# Patient Record
Sex: Female | Born: 1965 | Race: White | Hispanic: No | Marital: Married | Smoking: Former smoker
Health system: Southern US, Community
[De-identification: ages and names within clinical notes are randomized; demographics above are authoritative.]

## PROBLEM LIST (undated history)

## (undated) DIAGNOSIS — F329 Major depressive disorder, single episode, unspecified: Secondary | ICD-10-CM

## (undated) DIAGNOSIS — F32A Depression, unspecified: Secondary | ICD-10-CM

## (undated) DIAGNOSIS — C50911 Malignant neoplasm of unspecified site of right female breast: Secondary | ICD-10-CM

## (undated) HISTORY — DX: Major depressive disorder, single episode, unspecified: F32.9

## (undated) HISTORY — PX: TUBAL LIGATION: SHX77

## (undated) HISTORY — DX: Depression, unspecified: F32.A

## (undated) HISTORY — DX: Malignant neoplasm of unspecified site of right female breast: C50.911

---

## 2004-12-25 ENCOUNTER — Encounter: Payer: Self-pay | Admitting: Family Medicine

## 2005-01-15 ENCOUNTER — Other Ambulatory Visit: Admission: RE | Admit: 2005-01-15 | Discharge: 2005-01-15 | Payer: Self-pay | Admitting: Family Medicine

## 2005-01-15 ENCOUNTER — Ambulatory Visit: Payer: Self-pay | Admitting: Family Medicine

## 2005-02-19 ENCOUNTER — Ambulatory Visit: Payer: Self-pay | Admitting: Family Medicine

## 2005-08-24 ENCOUNTER — Ambulatory Visit: Payer: Self-pay | Admitting: Family Medicine

## 2006-02-01 DIAGNOSIS — E669 Obesity, unspecified: Secondary | ICD-10-CM | POA: Insufficient documentation

## 2006-06-30 ENCOUNTER — Encounter: Payer: Self-pay | Admitting: Family Medicine

## 2006-07-05 ENCOUNTER — Encounter (INDEPENDENT_AMBULATORY_CARE_PROVIDER_SITE_OTHER): Payer: Self-pay | Admitting: Specialist

## 2006-07-05 ENCOUNTER — Ambulatory Visit: Payer: Self-pay | Admitting: Family Medicine

## 2006-07-05 ENCOUNTER — Other Ambulatory Visit: Admission: RE | Admit: 2006-07-05 | Discharge: 2006-07-05 | Payer: Self-pay | Admitting: Family Medicine

## 2006-07-06 ENCOUNTER — Encounter: Payer: Self-pay | Admitting: Family Medicine

## 2006-07-06 ENCOUNTER — Telehealth: Payer: Self-pay | Admitting: Family Medicine

## 2006-07-06 LAB — CONVERTED CEMR LAB: Trich, Wet Prep: NONE SEEN

## 2006-07-08 ENCOUNTER — Telehealth: Payer: Self-pay | Admitting: Family Medicine

## 2006-07-12 ENCOUNTER — Telehealth (INDEPENDENT_AMBULATORY_CARE_PROVIDER_SITE_OTHER): Payer: Self-pay | Admitting: *Deleted

## 2006-07-12 ENCOUNTER — Encounter: Payer: Self-pay | Admitting: Family Medicine

## 2006-07-13 ENCOUNTER — Ambulatory Visit: Payer: Self-pay | Admitting: Family Medicine

## 2006-07-13 DIAGNOSIS — R7301 Impaired fasting glucose: Secondary | ICD-10-CM

## 2006-07-13 LAB — CONVERTED CEMR LAB: Blood Glucose, Fasting: 111 mg/dL

## 2007-01-11 ENCOUNTER — Ambulatory Visit: Payer: Self-pay | Admitting: Family Medicine

## 2007-01-11 DIAGNOSIS — L280 Lichen simplex chronicus: Secondary | ICD-10-CM

## 2008-12-16 ENCOUNTER — Ambulatory Visit: Payer: Self-pay | Admitting: Family Medicine

## 2008-12-16 DIAGNOSIS — N390 Urinary tract infection, site not specified: Secondary | ICD-10-CM | POA: Insufficient documentation

## 2008-12-16 LAB — CONVERTED CEMR LAB
Bilirubin Urine: NEGATIVE
Glucose, Urine, Semiquant: 100
Nitrite: POSITIVE

## 2009-10-30 ENCOUNTER — Ambulatory Visit: Payer: Self-pay | Admitting: Family Medicine

## 2009-10-30 ENCOUNTER — Other Ambulatory Visit: Admission: RE | Admit: 2009-10-30 | Discharge: 2009-10-30 | Payer: Self-pay | Admitting: Family Medicine

## 2009-10-30 DIAGNOSIS — R635 Abnormal weight gain: Secondary | ICD-10-CM

## 2009-10-31 ENCOUNTER — Encounter: Payer: Self-pay | Admitting: Family Medicine

## 2009-11-03 LAB — CONVERTED CEMR LAB
AST: 15 units/L (ref 0–37)
Albumin: 4 g/dL (ref 3.5–5.2)
Alkaline Phosphatase: 71 units/L (ref 39–117)
Calcium: 8.7 mg/dL (ref 8.4–10.5)
Chloride: 108 meq/L (ref 96–112)
Glucose, Bld: 108 mg/dL — ABNORMAL HIGH (ref 70–99)
LDL Cholesterol: 114 mg/dL — ABNORMAL HIGH (ref 0–99)
Potassium: 4.6 meq/L (ref 3.5–5.3)
Sodium: 140 meq/L (ref 135–145)
TSH: 1.6 microintl units/mL (ref 0.350–4.500)
Total Protein: 6.8 g/dL (ref 6.0–8.3)

## 2010-02-28 ENCOUNTER — Emergency Department (HOSPITAL_BASED_OUTPATIENT_CLINIC_OR_DEPARTMENT_OTHER): Admission: EM | Admit: 2010-02-28 | Discharge: 2010-02-28 | Payer: Self-pay | Admitting: Emergency Medicine

## 2010-03-01 ENCOUNTER — Ambulatory Visit: Payer: Self-pay | Admitting: Interventional Radiology

## 2010-03-25 ENCOUNTER — Telehealth (INDEPENDENT_AMBULATORY_CARE_PROVIDER_SITE_OTHER): Payer: Self-pay | Admitting: *Deleted

## 2010-04-08 DIAGNOSIS — K810 Acute cholecystitis: Secondary | ICD-10-CM

## 2010-04-26 HISTORY — PX: CHOLECYSTECTOMY, LAPAROSCOPIC: SHX56

## 2010-05-26 NOTE — Assessment & Plan Note (Signed)
Summary: CPE with pap   Vital Signs:  Patient profile:   45 year old female Menstrual status:  regular LMP:     10/06/2009 Height:      63.25 inches Weight:      196 pounds BMI:     34.57 O2 Sat:      98 % on Room air Pulse rate:   97 / minute BP sitting:   132 / 82  (left arm) Cuff size:   large  Vitals Entered By: Payton Spark CMA (October 30, 2009 9:32 AM)  O2 Flow:  Room air CC: CPE w/ pap LMP (date): 10/06/2009     Menstrual Status regular Enter LMP: 10/06/2009 Last PAP Result Done   Primary Care Provider:  Seymour Bars DO  CC:  CPE w/ pap.  History of Present Illness: 45 yo WF presents for CPE with pap smear.  Periods are regular each month.  She had a BTL for contraception.  Denies vasomotor flushing.  She is going thru a divorce.  She is still smoking.  She is fairly happy.  She denies fam hx of breast or colon cancer.  Denies fam hx of premature heart dz.  Tdap done in 08.  Denies hx of abnormal pap.  Denies voiding problems or vaginal discharge.  She is overdue for a mammogram.  Taking MVI daily.  Has a fairly poor diet, sedentary job and no exercise.    Current Medications (verified): 1)  Multivitamins  Tabs (Multiple Vitamin) 2)  Claritin 10 Mg Caps (Loratadine)  Allergies (verified): 1)  ! Codeine  Past History:  Past Medical History: G1P1001  NSVD  hx depression    Past Surgical History: Reviewed history from 02/01/2006 and no changes required. BTL  Family History: Reviewed history from 06/30/2006 and no changes required. 1 brother died in MVA  1 sister healthy  father DM, HTN  Mother healthy  Social History: Reviewed history from 02/01/2006 and no changes required. Getting divorced  to [place].  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  Has 1 son, 2 yo  and 2 step children.  Walks 3 days wk, trying to lose wt.  Smokes 1/2 ppd x 23 yrs.  Review of Systems  The patient denies anorexia, fever, weight loss, weight gain, vision  loss, decreased hearing, hoarseness, chest pain, syncope, dyspnea on exertion, peripheral edema, prolonged cough, headaches, hemoptysis, abdominal pain, melena, hematochezia, severe indigestion/heartburn, hematuria, incontinence, genital sores, muscle weakness, suspicious skin lesions, transient blindness, difficulty walking, depression, unusual weight change, abnormal bleeding, enlarged lymph nodes, angioedema, breast masses, and testicular masses.    Physical Exam  General:  alert, well-developed, well-nourished, and well-hydrated.  obese Head:  normocephalic and atraumatic.   Eyes:  pupils equal, pupils round, and pupils reactive to light.   Ears:  EACs patent; TMs translucent and gray with good cone of light and bony landmarks.  Nose:  no nasal discharge.   Mouth:  pharynx pink and moist.   Neck:  no masses.   Breasts:  No mass, nodules, thickening, tenderness, bulging, retraction, inflamation, nipple discharge or skin changes noted.   Lungs:  Normal respiratory effort, chest expands symmetrically. Lungs are clear to auscultation, no crackles or wheezes. Heart:  Normal rate and regular rhythm. S1 and S2 normal without gallop, murmur, click, rub or other extra sounds. Abdomen:  Bowel sounds positive,abdomen soft and non-tender without masses, organomegaly or  Genitalia:  Pelvic Exam:        External: normal female genitalia  without lesions or masses        Vagina: normal without lesions or masses        Cervix: normal without lesions or masses        Adnexa: normal bimanual exam without masses or fullness        Uterus: normal by palpation        Pap smear: performed Pulses:  2+ radial and pedal pulses Extremities:  no LE edema Skin:  color normal and no suspicious lesions.   Cervical Nodes:  No lymphadenopathy noted Psych:  good eye contact, not anxious appearing, and not depressed appearing.     Impression & Recommendations:  Problem # 1:  ROUTINE GYNECOLOGICAL EXAMINATION  (ICD-V72.31) Thin prep pap + STD testing done. Update fasting labs and mammogram. MVI/ Calcium with D daily + healthy diet and regular exercise recommended. BP at goal.  BMI 34 = class I obesity. Tdap done 08.   RTC for f/u as needed.  Complete Medication List: 1)  Multivitamins Tabs (Multiple vitamin) 2)  Claritin 10 Mg Caps (Loratadine)  Other Orders: T-Mammography Bilateral Screening (11914) T-Comprehensive Metabolic Panel ([id number]) T-Lipid Profile ([id number]) T-TSH (95284-13244) T-HIV Antibody  (Reflex) [id number]) T-RPR (Syphilis) ([id number])

## 2010-05-28 NOTE — Progress Notes (Signed)
Summary: surgical referral  Phone Note Call from Patient Call back at Work Phone [phone]   Caller: Patient Call For: Sheila Bars DO Action Taken: Appt Scheduled Today, Patient advised to call 911 Summary of Call: Pt calls and wants to get a referral to general surgeon for gallstones. Was seen in [place] and done scan there and it showed multiple stones at the first of the month. Wants to see a surgeon in [place] Initial call taken by: Kathlene November LPN,  March 25, 2010 11:31 AM  Follow-up for Phone Call        Pls confirm that she had a RUQ done.  Will need to see report. Follow-up by: Sheila Bars DO,  March 25, 2010 2:43 PM  Additional Follow-up for Phone Call Additional follow up Details #1::        Called above number and was transferred to 4 different people and then to the wrong VM. Called back and no answer.  Additional Follow-up by: Payton Spark CMA,  March 25, 2010 4:42 PM    Additional Follow-up for Phone Call Additional follow up Details #2::    Closing note until Pt calls back   Appended Document: surgical referral Pt called back stating she did have all scans done at [facility] ED. She would like to be referred to Dr. Carollee Massed w/ [facility]. She also requests a Rx for Protonix. Please advise. Arvilla Market CMA, Michelle April 08, 2010 9:28 AM   Appended Document: surgical referral Referral made to Dr Carollee Massed.  I was able to pull up her u/s report +++ for cholecystitis at [facility].  Sheila Stanton, D.O. Protonix RX sent to pharmacy.  Sheila Stanton, D.O.

## 2010-07-07 LAB — URINE MICROSCOPIC-ADD ON

## 2010-07-07 LAB — COMPREHENSIVE METABOLIC PANEL
ALT: 27 U/L (ref 0–35)
AST: 23 U/L (ref 0–37)
Albumin: 3.8 g/dL (ref 3.5–5.2)
Alkaline Phosphatase: 92 U/L (ref 39–117)
BUN: 15 mg/dL (ref 6–23)
GFR calc Af Amer: >60 mL/min (ref >60)
Potassium: 4.3 mEq/L (ref 3.5–5.1)
Sodium: 144 mEq/L (ref 135–145)
Total Protein: 6.8 g/dL (ref 6.0–8.3)

## 2010-07-07 LAB — DIFFERENTIAL
Basophils Relative: 1 % (ref 0–1)
Eosinophils Relative: 5 % (ref 0–5)
Monocytes Absolute: 1 10*3/uL (ref 0.1–1.0)
Monocytes Relative: 12 % (ref 3–12)
Neutro Abs: 5.4 10*3/uL (ref 1.7–7.7)

## 2010-07-07 LAB — URINALYSIS, ROUTINE W REFLEX MICROSCOPIC
Glucose, UA: NEGATIVE mg/dL
Ketones, ur: NEGATIVE mg/dL
Leukocytes, UA: NEGATIVE
pH: 7.5 (ref 5.0–8.0)

## 2010-07-07 LAB — CBC
Platelets: 258 10*3/uL (ref 150–400)
RDW: 12.2 % (ref 11.5–15.5)
WBC: 8.4 10*3/uL (ref 4.0–10.5)

## 2010-10-07 ENCOUNTER — Other Ambulatory Visit: Payer: Self-pay | Admitting: Family Medicine

## 2011-02-21 ENCOUNTER — Encounter: Payer: Self-pay | Admitting: Family Medicine

## 2011-02-26 ENCOUNTER — Ambulatory Visit (INDEPENDENT_AMBULATORY_CARE_PROVIDER_SITE_OTHER): Payer: Self-pay | Admitting: Family Medicine

## 2011-02-26 ENCOUNTER — Encounter: Payer: Self-pay | Admitting: Family Medicine

## 2011-02-26 ENCOUNTER — Other Ambulatory Visit (HOSPITAL_COMMUNITY)
Admission: RE | Admit: 2011-02-26 | Discharge: 2011-02-26 | Disposition: A | Discharge status: Home / Self Care | Payer: BC Managed Care – PPO | Source: Ambulatory Visit | Attending: Family Medicine | Admitting: Family Medicine

## 2011-02-26 VITALS — BP 145/86 | HR 88 | Wt 194.0 lb

## 2011-02-26 DIAGNOSIS — Z1231 Encounter for screening mammogram for malignant neoplasm of breast: Secondary | ICD-10-CM

## 2011-02-26 DIAGNOSIS — Z1159 Encounter for screening for other viral diseases: Secondary | ICD-10-CM | POA: Insufficient documentation

## 2011-02-26 DIAGNOSIS — Z01419 Encounter for gynecological examination (general) (routine) without abnormal findings: Secondary | ICD-10-CM

## 2011-02-26 LAB — COMPLETE METABOLIC PANEL WITH GFR
Albumin: 4.3 g/dL (ref 3.5–5.2)
CO2: 27 mEq/L (ref 19–32)
Calcium: 9.4 mg/dL (ref 8.4–10.5)
GFR, Est African American: >89 mL/min (ref >89)
GFR, Est Non African American: >89 mL/min (ref >89)
Glucose, Bld: 98 mg/dL (ref 70–99)
Potassium: 4.4 mEq/L (ref 3.5–5.3)
Sodium: 141 mEq/L (ref 135–145)
Total Protein: 7 g/dL (ref 6.0–8.3)

## 2011-02-26 LAB — LIPID PANEL: HDL: 55 mg/dL (ref >39)

## 2011-02-26 MED ORDER — CLOBETASOL PROPIONATE 0.05 % EX OINT: TOPICAL_OINTMENT | Freq: Two times a day (BID) | CUTANEOUS | Status: AC

## 2011-02-26 NOTE — Patient Instructions (Signed)
Start a regular exercise program and make sure you are eating a healthy diet Try to eat 4 servings of dairy a day or take a calcium supplement (500mg twice a day). Your vaccines are up to date.   

## 2011-02-26 NOTE — Progress Notes (Signed)
Subjective:     Sheila Stanton is a 44 y.o. female and is here for a comprehensive physical exam. The patient reports no problems. She did let me know she has a rash on her left labia. She says it has been there on and off for years. She's tried topical steroid creams and other things in the past and none seem to help. It is very itchy. She thinks that her night sweats from her menopause it might aggravate it. She has also tried barrier creams without relief.  History   Social History  . Marital Status: Married    Spouse Name: N/A    Number of Children: 1  . Years of Education: N/A   Occupational History  . Location manager.    Social History Main Topics  . Smoking status: Current Everyday Smoker -- 0.5 packs/day for 23 years    Types: Cigarettes  . Smokeless tobacco: Not on file   Comment: trying to cut back.   . Alcohol Use: 0.5 - 1.0 oz/week    1-2 drink(s) per week  . Drug Use: Not on file  . 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   Other Topics Concern  . 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 Influenza Vaccine  01/25/2012  . Pap Smear  02/25/2014  . Tetanus/tdap  07/04/2016    The following portions of the patient's history were reviewed and updated as appropriate: allergies, current medications, past family history, past medical history, past social history, past surgical history and problem list.  Review of Systems A comprehensive review of systems was negative.   Objective:    BP 145/86  Pulse 88  Wt 194 lb (87.998 kg)  LMP 02/10/2011 General appearance: alert, cooperative and appears stated age Head: Normocephalic, without obvious abnormality, atraumatic Eyes: Conjunctiva clear, extraocular movements intact, PERRLA she does have  contact lenses in today. Ears: normal TM's and external ear canals both ears Nose: Nares normal. Septum midline. Mucosa normal. No drainage or sinus tenderness. Throat: lips, mucosa, and tongue normal; teeth and gums normal Neck: no adenopathy, no carotid bruit, no JVD, supple, symmetrical, trachea midline and thyroid not enlarged, symmetric, no tenderness/mass/nodules Back: symmetric, no curvature. ROM normal. No CVA tenderness. Lungs: clear to auscultation bilaterally Breasts: normal appearance, no masses or tenderness Heart: regular rate and rhythm, S1, S2 normal, no murmur, click, rub or gallop Abdomen: soft, non-tender; bowel sounds normal; no masses,  no organomegaly Pelvic: cervix normal in appearance, external genitalia normal, no adnexal masses or tenderness, no cervical motion tenderness, rectovaginal septum normal, uterus normal size, shape, and consistency and vagina normal without discharge Extremities: extremities normal, atraumatic, no cyanosis or edema Pulses: 2+ and symmetric Skin: Skin color, texture, turgor normal. No rashes or lesions Lymph nodes: Cervical, supraclavicular, and axillary nodes normal. Neurologic: Grossly normal  On her left labia the skin appears to be sclerotic. It is slightly pink with a whitish discoloration. Sheila Stanton has 2 small about 0.5 cm ulcerations in the middle of that area.   Assessment:    Healthy female exam.   Plan:     See After Visit Summary for Counseling Recommendations  Start a regular exercise program and make sure you are eating a healthy diet Try  to eat 4 servings of dairy a day or take a calcium supplement (500mg  twice a day). Your vaccines are up to date.  She was given a lab slip for screening labs We will call her with the Pap smear results. We will schedule her for her first mammogram. No prior personal or family history of breast problems.  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 She says she has tried topical steroids in the  past. I would like her to retry clobetasol ointment and see if this makes a difference. If her symptoms are not improving significantly over the next few weeks and I recommend referral to GYN for biopsy of the lesion.

## 2011-03-01 ENCOUNTER — Other Ambulatory Visit: Payer: Self-pay | Admitting: *Deleted

## 2011-03-01 MED ORDER — PANTOPRAZOLE SODIUM 40 MG PO TBEC: 40.0000 mg | DELAYED_RELEASE_TABLET | Freq: Every day | ORAL | Status: DC

## 2011-04-16 ENCOUNTER — Other Ambulatory Visit: Payer: Self-pay | Admitting: *Deleted

## 2011-04-16 MED ORDER — PANTOPRAZOLE SODIUM 40 MG PO TBEC: 40.0000 mg | DELAYED_RELEASE_TABLET | Freq: Every day | ORAL | Status: DC

## 2011-09-12 ENCOUNTER — Other Ambulatory Visit: Payer: Self-pay | Admitting: Family Medicine

## 2013-01-18 ENCOUNTER — Ambulatory Visit (INDEPENDENT_AMBULATORY_CARE_PROVIDER_SITE_OTHER): Payer: BC Managed Care – PPO | Admitting: Family Medicine

## 2013-01-18 ENCOUNTER — Encounter: Payer: Self-pay | Admitting: Family Medicine

## 2013-01-18 VITALS — BP 137/82 | HR 76 | Temp 98.2°F | Wt 201.0 lb

## 2013-01-18 DIAGNOSIS — R3915 Urgency of urination: Secondary | ICD-10-CM

## 2013-01-18 LAB — POCT URINALYSIS DIPSTICK
Nitrite, UA: NEGATIVE
Urobilinogen, UA: 0.2
pH, UA: 6

## 2013-01-18 MED ORDER — CIPROFLOXACIN HCL 500 MG PO TABS: 500.0000 mg | ORAL_TABLET | Freq: Two times a day (BID) | ORAL | Status: AC

## 2013-01-18 NOTE — Patient Instructions (Signed)
Urinary Tract Infection  Urinary tract infections (UTIs) can develop anywhere along your urinary tract. Your urinary tract is your body's drainage system for removing wastes and extra water. Your urinary tract includes two kidneys, two ureters, a bladder, and a urethra. Your kidneys are a pair of bean-shaped organs. Each kidney is about the size of your fist. They are located below your ribs, one on each side of your spine.  CAUSES  Infections are caused by microbes, which are microscopic organisms, including fungi, viruses, and bacteria. These organisms are so small that they can only be seen through a microscope. Bacteria are the microbes that most commonly cause UTIs.  SYMPTOMS   Symptoms of UTIs may vary by age and gender of the patient and by the location of the infection. Symptoms in young women typically include a frequent and intense urge to urinate and a painful, burning feeling in the bladder or urethra during urination. Older women and men are more likely to be tired, shaky, and weak and have muscle aches and abdominal pain. A fever may mean the infection is in your kidneys. Other symptoms of a kidney infection include pain in your back or sides below the ribs, nausea, and vomiting.  DIAGNOSIS  To diagnose a UTI, your caregiver will ask you about your symptoms. Your caregiver also will ask to provide a urine sample. The urine sample will be tested for bacteria and white blood cells. White blood cells are made by your body to help fight infection.  TREATMENT   Typically, UTIs can be treated with medication. Because most UTIs are caused by a bacterial infection, they usually can be treated with the use of antibiotics. The choice of antibiotic and length of treatment depend on your symptoms and the type of bacteria causing your infection.  HOME CARE INSTRUCTIONS   If you were prescribed antibiotics, take them exactly as your caregiver instructs you. Finish the medication even if you feel better after you  have only taken some of the medication.   Drink enough water and fluids to keep your urine clear or pale yellow.   Avoid caffeine, tea, and carbonated beverages. They tend to irritate your bladder.   Empty your bladder often. Avoid holding urine for long periods of time.   Empty your bladder before and after sexual intercourse.   After a bowel movement, women should cleanse from front to back. Use each tissue only once.  SEEK MEDICAL CARE IF:    You have back pain.   You develop a fever.   Your symptoms do not begin to resolve within 3 days.  SEEK IMMEDIATE MEDICAL CARE IF:    You have severe back pain or lower abdominal pain.   You develop chills.   You have nausea or vomiting.   You have continued burning or discomfort with urination.  MAKE SURE YOU:    Understand these instructions.   Will watch your condition.   Will get help right away if you are not doing well or get worse.  Document Released: 01/20/2005 Document Revised: 10/12/2011 Document Reviewed: 05/21/2011  Exit[employer] Patient Information 2014 [employer].

## 2013-01-18 NOTE — Progress Notes (Signed)
  Subjective:    Patient ID: Sheila Stanton, female    DOB: 09/08/1965, 47 y.o.   MRN: [id number]  HPI Urinary urgency and dysuria x 3 days. No fever. Some back pain. She has been drinking cranberry juice. She denies any hematuria. She has had some low back pain but not sure if it's her actual back. No abdominal pain.   Review of Systems     Objective:   Physical Exam  Constitutional: She is oriented to person, place, and time. She appears well-developed and well-nourished.  HENT:  Head: Normocephalic and atraumatic.  Musculoskeletal:  No CVA tenderness.  SOme tenderenss over paraspinous muscle so the lumbar spine  Neurological: She is alert and oriented to person, place, and time.  Skin: Skin is warm and dry.  Psychiatric: She has a normal mood and affect. Her behavior is normal.          Assessment & Plan:  UTI-will treat with Cipro. Call if not significantly better in 5 days. Handout given. Make sure staying hydrated.

## 2013-01-20 LAB — URINE CULTURE

## 2013-10-18 ENCOUNTER — Encounter: Payer: Self-pay | Admitting: Family Medicine

## 2013-10-18 DIAGNOSIS — C50911 Malignant neoplasm of unspecified site of right female breast: Secondary | ICD-10-CM

## 2013-10-18 HISTORY — DX: Malignant neoplasm of unspecified site of right female breast: C50.911

## 2016-01-30 ENCOUNTER — Ambulatory Visit (INDEPENDENT_AMBULATORY_CARE_PROVIDER_SITE_OTHER): Payer: 59

## 2016-01-30 ENCOUNTER — Ambulatory Visit (INDEPENDENT_AMBULATORY_CARE_PROVIDER_SITE_OTHER): Payer: 59 | Admitting: Sports Medicine

## 2016-01-30 ENCOUNTER — Encounter: Payer: Self-pay | Admitting: Sports Medicine

## 2016-01-30 DIAGNOSIS — R5381 Other malaise: Secondary | ICD-10-CM

## 2016-01-30 DIAGNOSIS — R0989 Other specified symptoms and signs involving the circulatory and respiratory systems: Secondary | ICD-10-CM

## 2016-01-30 DIAGNOSIS — R05 Cough: Secondary | ICD-10-CM

## 2016-01-30 DIAGNOSIS — R062 Wheezing: Secondary | ICD-10-CM

## 2016-01-30 MED ORDER — PREDNISONE 50 MG PO TABS: 50.0000 mg | ORAL_TABLET | Freq: Every day | ORAL | 0 refills | Status: DC

## 2016-01-30 MED ORDER — AZITHROMYCIN 250 MG PO TABS: ORAL_TABLET | ORAL | 0 refills | Status: DC

## 2016-01-30 NOTE — Progress Notes (Signed)
  Subjective:    CC: Cough and reestablish care.   HPI:  This is a pleasant 50 year old female, previous patient of Dr. Madilyn Fireman, she has not seen her in over 3 years. Has had breast cancer, stage I, lymph node involvement, but did not under chemotherapy, did have a lumpectomy, radiation, and hormonal treatment. Overall was doing well until approximately 2 weeks ago when she developed increasing runny nose, cough, malaise. No fevers or chills, no other constitutional symptoms, no night sweats or weight loss. Symptoms are moderate, persistent.  Past medical history:  Negative.  See flowsheet/record as well for more information.  Surgical history: Negative.  See flowsheet/record as well for more information.  Family history: Negative.  See flowsheet/record as well for more information.  Social history: Negative.  See flowsheet/record as well for more information.  Allergies, and medications have been entered into the medical record, reviewed, and no changes needed.    Review of Systems: No headache, visual changes, nausea, vomiting, diarrhea, constipation, dizziness, abdominal pain, skin rash, fevers, chills, night sweats, swollen lymph nodes, weight loss, chest pain, body aches, joint swelling, muscle aches, shortness of breath, mood changes, visual or auditory hallucinations.  Objective:    General: Well Developed, well nourished, and in no acute distress.  Neuro: Alert and oriented x3, extra-ocular muscles intact, sensation grossly intact.  HEENT: Normocephalic, atraumatic, pupils equal round reactive to light, neck supple, no masses, no lymphadenopathy, thyroid nonpalpable. Oropharynx, nasopharynx, ear canals unremarkable with the exception of boggy and erythematous turbinates. Skin: Warm and dry, no rashes noted.  Cardiac: Regular rate and rhythm, no murmurs rubs or gallops.  Respiratory: Diffuse inspiratory and expiratory wheezes with coarse sounds. Not using accessory muscles, speaking in  full sentences.  Abdominal: Soft, nontender, nondistended, positive bowel sounds, no masses, no organomegaly.  Musculoskeletal: Shoulder, elbow, wrist, hip, knee, ankle stable, and with full range of motion.  Impression and Recommendations:    The patient was counselled, risk factors were discussed, anticipatory guidance given.  Wheezing In a longtime smoker. Current cough and wheeze for almost 2 weeks now. Azithromycin, prednisone, chest x-ray.  Return for pre-and postbronchodilator spirometry. I do suspect this represents early COPD.

## 2016-01-30 NOTE — Assessment & Plan Note (Signed)
In a longtime smoker. Current cough and wheeze for almost 2 weeks now. Azithromycin, prednisone, chest x-ray.  Return for pre-and postbronchodilator spirometry. I do suspect this represents early COPD.

## 2016-06-22 LAB — HM MAMMOGRAPHY

## 2016-07-08 ENCOUNTER — Ambulatory Visit (INDEPENDENT_AMBULATORY_CARE_PROVIDER_SITE_OTHER): Payer: 59

## 2016-07-08 ENCOUNTER — Ambulatory Visit (INDEPENDENT_AMBULATORY_CARE_PROVIDER_SITE_OTHER): Payer: 59 | Admitting: Family Medicine

## 2016-07-08 VITALS — BP 161/71 | HR 89 | Wt 203.0 lb

## 2016-07-08 DIAGNOSIS — M7732 Calcaneal spur, left foot: Secondary | ICD-10-CM

## 2016-07-08 DIAGNOSIS — M79605 Pain in left leg: Secondary | ICD-10-CM

## 2016-07-08 DIAGNOSIS — M25561 Pain in right knee: Secondary | ICD-10-CM

## 2016-07-08 DIAGNOSIS — M1712 Unilateral primary osteoarthritis, left knee: Secondary | ICD-10-CM

## 2016-07-08 DIAGNOSIS — M25562 Pain in left knee: Secondary | ICD-10-CM | POA: Diagnosis not present

## 2016-07-08 MED ORDER — DICLOFENAC SODIUM 1 % TD GEL: 4.0000 g | Freq: Four times a day (QID) | TRANSDERMAL | 11 refills | Status: AC

## 2016-07-08 NOTE — Progress Notes (Signed)
Subjective:    I'm seeing this patient as a consultation for:  METHENEY,CATHERINE, MD   CC: left knee pain  HPI: Sheila Stanton is a 51 yo woman with a PMH of breast cancer who present with worsening left knee pain and swelling.  She reports her knee started hurting around 2 months ago and became intolerable 3 weeks prior to presentation.  Around this time, her 2nd and 3rd toe started to feel numb and "tingly" that radiates towards her leg.  She states her knee has been red and swollen and denies ever experiencing anything like this before. She currently is taking Femara for breast cancer. Her oncologist stopped this medicine for a week and response to these symptoms. She notes that her pain did not improve at all.  Past medical history, Surgical history, Family history not pertinant except as noted below, Social history, Allergies, and medications have been entered into the medical record, reviewed, and no changes needed.   Review of Systems: No headache, visual changes, nausea, vomiting, diarrhea, constipation, dizziness, abdominal pain, skin rash, fevers, chills, night sweats, weight loss, swollen lymph nodes, body aches, muscle aches, chest pain, shortness of breath, mood changes, visual or auditory hallucinations.   Objective:    Vitals:   07/08/16 1542  BP: (!) 161/71  Pulse: 89   General: Well Developed, well nourished, and in no acute distress.  Neuro/Psych: Alert and oriented x3, extra-ocular muscles intact, able to move all 4 extremities, sensation grossly intact. Skin: Warm and dry, no rashes noted.  Respiratory: Not using accessory muscles, speaking in full sentences, trachea midline.  Cardiovascular: Pulses palpable, no extremity edema. Abdomen: Does not appear distended. MSK: Left lower leg is unremarkable. No significant effusion. Normal knee motion without crepitations. Tender to palpation anterior medial knee especially along the tibia. Stable ligamentous exam.  Intact flexion and extension strength. Tibia is tender to palpation along the anterior medial mid third. She has normal foot and ankle motion. She walks with an antalgic gait.  Limited musculoskeletal ultrasound of the left anterior knee shows increased Doppler activity along the anterior proximal tibia at the knee medial to the patellar tendon. This is a area of maximal tenderness.  No results found for this or any previous visit (from the past 24 hour(s)). Dg Knee 1-2 Views Right  Result Date: 07/08/2016 CLINICAL DATA:  Pain for 2 months EXAM: RIGHT KNEE - 1-2 VIEW COMPARISON:  None. FINDINGS: Standing frontal and standing tunnel images obtained. No fracture or dislocation. Visualized joint spaces appear normal. There is a small bone island in the distal femur. IMPRESSION: Small bone island distal femur. No appreciable arthropathy. No fracture or dislocation. Electronically Signed   By: Lowella Grip III M.D.   On: 07/08/2016 16:30   Dg Tibia/fibula Left  Result Date: 07/08/2016 CLINICAL DATA:  Pain for 2 months EXAM: LEFT TIBIA AND FIBULA - 2 VIEW COMPARISON:  None. FINDINGS: Frontal and lateral views were obtained. No fracture or dislocation. No abnormal periosteal reaction. There are spurs arising from the posterior and inferior calcaneus. No joint space narrowing or erosion evident. IMPRESSION: Calcaneal spurs. No fracture or dislocation. No abnormal periosteal reaction. No appreciable joint space narrowing or erosion. Electronically Signed   By: Lowella Grip III M.D.   On: 07/08/2016 16:31   Dg Knee Complete 4 Views Left  Result Date: 07/08/2016 CLINICAL DATA:  Pain for 2 months EXAM: LEFT KNEE - COMPLETE 4+ VIEW COMPARISON:  None. FINDINGS: Standing frontal, standing tunnel, lateral, and sunrise  patellar images obtained. There is no evident fracture or dislocation. No joint effusion. The joint spaces appear normal. No erosive change. IMPRESSION: No fracture or joint effusion.  No  appreciable arthropathy. Electronically Signed   By: Lowella Grip III M.D.   On: 07/08/2016 16:30    Impression and Recommendations:    Assessment and Plan: 51 y.o. female with Knee pain unclear etiology. Patient has a history of breast cancer. She has an area of tenderness on ultrasound with increased Doppler activity. Plan for MRI. We'll obtain IV contrast given her cancer history. Patient will follow-up after MRI. Treat empirically with diclofenac gel..   Discussed warning signs or symptoms. Please see discharge instructions. Patient expresses understanding.

## 2016-07-08 NOTE — Patient Instructions (Signed)
Thank you for coming in today. Get MRI soon.  You should hear about timing soon.  Follow up with me 2 -4 days after MRI to go over results.  Thursdays are best for this.  Apply voltaren gel up to 4x daily for pain.  Return sooner if needed.

## 2016-07-19 ENCOUNTER — Ambulatory Visit (INDEPENDENT_AMBULATORY_CARE_PROVIDER_SITE_OTHER): Payer: 59

## 2016-07-19 DIAGNOSIS — M23222 Derangement of posterior horn of medial meniscus due to old tear or injury, left knee: Secondary | ICD-10-CM

## 2016-07-19 DIAGNOSIS — M25462 Effusion, left knee: Secondary | ICD-10-CM | POA: Diagnosis not present

## 2016-07-19 DIAGNOSIS — M79605 Pain in left leg: Secondary | ICD-10-CM

## 2016-07-19 DIAGNOSIS — M1712 Unilateral primary osteoarthritis, left knee: Secondary | ICD-10-CM

## 2016-07-19 MED ORDER — GADOBENATE DIMEGLUMINE 529 MG/ML IV SOLN
19.0000 mL | Freq: Once | INTRAVENOUS | Status: AC | PRN
  Administered 2016-07-19: 19 mL via INTRAVENOUS

## 2016-07-20 ENCOUNTER — Ambulatory Visit (INDEPENDENT_AMBULATORY_CARE_PROVIDER_SITE_OTHER): Payer: 59 | Admitting: Family Medicine

## 2016-07-20 DIAGNOSIS — S83242A Other tear of medial meniscus, current injury, left knee, initial encounter: Secondary | ICD-10-CM | POA: Diagnosis not present

## 2016-07-20 NOTE — Patient Instructions (Signed)
Thank you for coming in today. Continue the voltaren gel.  Let me know if you want an injection or to see a surgeon or a second opinion.    Meniscus Tear A meniscus tear is a knee injury in which a piece of the meniscus is torn. The meniscus is a thick, rubbery, wedge-shaped cartilage in the knee. Two menisci are located in each knee. They sit between the upper bone (femur) and lower bone (tibia) that make up the knee joint. Each meniscus acts as a shock absorber for the knee. A torn meniscus is one of the most common types of knee injuries. This injury can range from mild to severe. Surgery may be needed for a severe tear. What are the causes? This injury may be caused by any squatting, twisting, or pivoting movement. Sports-related injuries are the most common cause. These often occur from:  Running and stopping suddenly.  Changing direction.  Being tackled or knocked off your feet. As people get older, their meniscus gets thinner and weaker. In these people, tears can happen more easily, such as from climbing stairs. What increases the risk? This injury is more likely to happen to:  People who play contact sports.  Males.  People who are 50?50 years of age. What are the signs or symptoms? Symptoms of this injury include:  Knee pain, especially at the side of the knee joint. You may feel pain when the injury occurs, or you may only hear a pop and feel pain later.  A feeling that your knee is clicking, catching, locking, or giving way.  Not being able to fully bend or extend your knee.  Bruising or swelling in your knee. How is this diagnosed? This injury may be diagnosed based on your symptoms and a physical exam. The physical exam may include:  Moving your knee in different ways.  Feeling for tenderness.  Listening for a clicking sound.  Checking if your knee locks or catches. You may also have tests, such as:  X-rays.  MRI.  A procedure to look inside your knee  with a narrow surgical telescope (arthroscopy). You may be referred to a knee specialist (orthopedic surgeon). How is this treated? Treatment for this injury depends on the severity of the tear. Treatment for a mild tear may include:  Rest.  Medicine to reduce pain and swelling. This is usually a nonsteroidal anti-inflammatory drug (NSAID).  A knee brace or an elastic sleeve or wrap.  Using crutches or a walker to keep weight off your knee and to help you walk.  Exercises to strengthen your knee (physical therapy). You may need surgery if you have a severe tear or if other treatments are not working. Follow these instructions at home: Managing pain and swelling   Take over-the-counter and prescription medicines only as told by your health care provider.  If directed, apply ice to the injured area:  Put ice in a plastic bag.  Place a towel between your skin and the bag.  Leave the ice on for 20 minutes, 2-3 times per day.  Raise (elevate) the injured area above the level of your heart while you are sitting or lying down. Activity   Do not use the injured limb to support your body weight until your health care provider says that you can. Use crutches or a walker as told by your health care provider.  Return to your normal activities as told by your health care provider. Ask your health care provider what activities are  safe for you.  Perform range-of-motion exercises only as told by your health care provider.  Begin doing exercises to strengthen your knee and leg muscles only as told by your health care provider. After you recover, your health care provider may recommend these exercises to help prevent another injury. General instructions   Use a knee brace or elastic wrap as told by your health care provider.  Keep all follow-up visits as told by your health care provider. This is important. Contact a health care provider if:  You have a fever.  Your knee becomes red,  tender, or swollen.  Your pain medicine is not helping.  Your symptoms get worse or do not improve after 2 weeks of home care. This information is not intended to replace advice given to you by your health care provider. Make sure you discuss any questions you have with your health care provider. Document Released: 07/03/2002 Document Revised: 09/18/2015 Document Reviewed: 08/05/2014 Elsevier Interactive Patient Education  2017 [employer].

## 2016-07-21 DIAGNOSIS — S83249A Other tear of medial meniscus, current injury, unspecified knee, initial encounter: Secondary | ICD-10-CM | POA: Insufficient documentation

## 2016-07-21 NOTE — Progress Notes (Signed)
Sheila Stanton is a 51 y.o. female who presents to [facility] today for left knee pain. Patient was seen previously for ongoing left knee pain. Given her cancer history an MRI with and without IV contrast was obtained. The right shows a medial meniscus tear. She notes continued pain worse with activity. In the past she's had a bad experience with steroid injection would like to avoid injection of possible.   Past Medical History:  Diagnosis Date  . Depression   . NSVD (normal spontaneous vaginal delivery)    Past Surgical History:  Procedure Laterality Date  . CHOLECYSTECTOMY, LAPAROSCOPIC  2012  . TUBAL LIGATION     Social History  Substance Use Topics  . Smoking status: Former Smoker    Packs/day: 0.50    Years: 23.00    Types: Cigarettes  . Smokeless tobacco: Not on file     Comment: trying to cut back.   . Alcohol use 0.5 - 1.0 oz/week    1 - 2 Standard drinks or equivalent per week     ROS:  As above   Medications: Current Outpatient Prescriptions  Medication Sig Dispense Refill  . CALCIUM PO Take by mouth.    . diclofenac sodium (VOLTAREN) 1 % GEL Apply 4 g topically 4 (four) times daily. To affected joint. 100 g 11  . letrozole (FEMARA) 2.5 MG tablet     . PARoxetine (PAXIL) 20 MG tablet      No current facility-administered medications for this visit.    Allergies  Allergen Reactions  . Vancomycin Itching    Red man syndrome- flushing  . Codeine      Exam:  BP (!) 138/58   Pulse 78  General: Well Developed, well nourished, and in no acute distress.  Neuro/Psych: Alert and oriented x3, extra-ocular muscles intact, able to move all 4 extremities, sensation grossly intact. Skin: Warm and dry, no rashes noted.  Respiratory: Not using accessory muscles, speaking in full sentences, trachea midline.  Cardiovascular: Pulses palpable, no extremity edema. Abdomen: Does not appear distended. MSK: Right knee  tender to palpation medial joint line normal motion.    No results found for this or any previous visit (from the past 48 hour(s)). Mr Knee Left W Wo Contrast  Result Date: 07/19/2016 CLINICAL DATA:  Medial left knee pain and swelling for 2 months. No known injury. History of breast cancer. EXAM: MRI OF THE LEFT KNEE WITHOUT AND WITH CONTRAST TECHNIQUE: Multiplanar, multisequence MR imaging of the knee was performed before and after the administration of intravenous contrast. CONTRAST:  19 ml MULTIHANCE GADOBENATE DIMEGLUMINE 529 MG/ML IV SOLN COMPARISON:  Plain films left knee 07/08/2016. FINDINGS: MENISCI Medial meniscus: A focal horizontal tear is seen in the mid aspect of the posterior horn. No displaced fragment. Lateral meniscus:  Intact. LIGAMENTS Cruciates:  Intact. Collaterals:  Intact. CARTILAGE Patellofemoral:  Normal. Medial:  Normal. Lateral:  Normal. Joint: Small effusion. Synovium about the knee appears somewhat thickened. Popliteal Fossa:  No Baker's cyst. Extensor Mechanism:  Intact. Bones: Normal marrow signal throughout. No evidence of metastatic disease. Other: None. IMPRESSION: Focal horizontal tear central posterior horn medial meniscus. Small joint effusion and synovial prominence compatible with synovitis. Electronically Signed   By: Inge Rise M.D.   On: 07/19/2016 10:36      Assessment and Plan: 51 y.o. female with medial meniscus injury. Discussed options. Patient is not sure what she wants to do. We discussed referral to surgery versus trial  of diclofenac gel versus injection. She elected try the diclofenac gel for little while longer and consider an injection. She'll let me know what she would like to do.    No orders of the defined types were placed in this encounter.   Discussed warning signs or symptoms. Please see discharge instructions. Patient expresses understanding.

## 2016-08-23 ENCOUNTER — Encounter: Payer: Self-pay | Admitting: Family Medicine

## 2016-08-23 ENCOUNTER — Ambulatory Visit (INDEPENDENT_AMBULATORY_CARE_PROVIDER_SITE_OTHER): Payer: 59 | Admitting: Family Medicine

## 2016-08-23 VITALS — BP 149/77 | HR 86 | Wt 197.0 lb

## 2016-08-23 DIAGNOSIS — S83242A Other tear of medial meniscus, current injury, left knee, initial encounter: Secondary | ICD-10-CM | POA: Diagnosis not present

## 2016-08-23 NOTE — Patient Instructions (Signed)
Thank you for coming in today. Return for recheck in about 1 month.  Return sooner if needed.   Call or go to the ER if you develop a large red swollen joint with extreme pain or oozing puss.

## 2016-08-24 ENCOUNTER — Encounter: Payer: Self-pay | Admitting: Family Medicine

## 2016-08-24 NOTE — Progress Notes (Signed)
Sheila Stanton is a 51 y.o. female who presents to [facility] today for left knee pain.   Sheila Stanton has had left knee pain for some time now. She was seen in March and subsequently failed trial of conservative management. She had an MRI which showed a small meniscus tear. She elected for watchful waiting and more conservative management. She is here today for steroid injection. She has pain and swelling worsens activity better with rest.   Past Medical History:  Diagnosis Date  . Depression   . 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 Status post lumpectomy 07/06/2013. 2.2 cm. Tumor grade 1. SL in one of two contain metastasis. He RPR positive, 90-100%. Her-2/neu negative. Ki-67 index of 25-35. Oncotype diagnosis score 12 which falls into the low risk category. Received Zoladex for 07/26/2013 and complete radiation 10/15/2013. Status post BSO on 06  . NSVD (normal spontaneous vaginal delivery)    Past Surgical History:  Procedure Laterality Date  . CHOLECYSTECTOMY, LAPAROSCOPIC  2012  . TUBAL LIGATION     Social History  Substance Use Topics  . Smoking status: Former Smoker    Packs/day: 0.50    Years: 23.00    Types: Cigarettes  . Smokeless tobacco: Never Used     Comment: trying to cut back.   . Alcohol use 0.5 - 1.0 oz/week    1 - 2 Standard drinks or equivalent per week     ROS:  As above   Medications: Current Outpatient Prescriptions  Medication Sig Dispense Refill  . CALCIUM PO Take by mouth.    . diclofenac sodium (VOLTAREN) 1 % GEL Apply 4 g topically 4 (four) times daily. To affected joint. 100 g 11  . letrozole (FEMARA) 2.5 MG tablet     . PARoxetine (PAXIL) 20 MG tablet      No current facility-administered medications for this visit.    Allergies  Allergen Reactions  . Vancomycin Itching    Red man  syndrome- flushing  . Codeine      Exam:  BP (!) 149/77   Pulse 86   Wt 197 lb (89.4 kg)   BMI 34.62 kg/m  General: Well Developed, well nourished, and in no acute distress.  Neuro/Psych: Alert and oriented x3, extra-ocular muscles intact, able to move all 4 extremities, sensation grossly intact. Skin: Warm and dry, no rashes noted.  Respiratory: Not using accessory muscles, speaking in full sentences, trachea midline.  Cardiovascular: Pulses palpable, no extremity edema. Abdomen: Does not appear distended. MSK: Left knee mild effusion. Tender to palpation medial joint line. Normal range of motion.  Procedure: Real-time Ultrasound Guided Injection of left knee  Device: GE Logiq E  Images permanently stored and available for review in the ultrasound unit. Verbal informed consent obtained. Discussed risks and benefits of procedure. Warned about infection bleeding damage to structures skin hypopigmentation and fat atrophy among others. Patient expresses understanding and agreement Time-out conducted.  Noted no overlying erythema, induration, or other signs of local infection.  Skin prepped in a sterile fashion.  Local anesthesia: Topical Ethyl chloride.  With sterile technique and under real time ultrasound guidance: 80 mg of Kenalog and 4 mL of Marcaine injected easily.  Completed without difficulty  Pain immediately resolved suggesting accurate placement of the medication.  Advised to call if fevers/chills, erythema, induration, drainage, or persistent bleeding.  Images permanently stored and available  for review in the ultrasound unit.  Impression: Technically successful ultrasound guided injection.      No results found for this or any previous visit (from the past 48 hour(s)). No results found.    Assessment and Plan: 51 y.o. female with knee pain due to meniscus tear on MRI. Plan for trial of steroid knee injection. Continue diclofenac gel as needed. Return  as needed.    No orders of the defined types were placed in this encounter.   Discussed warning signs or symptoms. Please see discharge instructions. Patient expresses understanding.

## 2016-09-02 ENCOUNTER — Telehealth: Payer: Self-pay | Admitting: *Deleted

## 2016-09-02 MED ORDER — GABAPENTIN 300 MG PO CAPS: ORAL_CAPSULE | ORAL | 3 refills | Status: AC

## 2016-09-02 NOTE — Telephone Encounter (Signed)
Will try gabapentin per patient request. If no better please return to clinic for recheck.

## 2016-09-02 NOTE — Telephone Encounter (Signed)
Patient called and left a message stating she still has knee pain even after having had an injection. The patient request a prescription for gabapentin. Please advise

## 2016-09-03 NOTE — Telephone Encounter (Signed)
Patient notified

## 2017-03-23 LAB — HM PAP SMEAR: HM PAP: NEGATIVE

## 2017-05-06 ENCOUNTER — Encounter: Payer: Self-pay | Admitting: Family Medicine

## 2017-10-25 LAB — HM COLONOSCOPY

## 2017-12-29 ENCOUNTER — Encounter: Payer: Self-pay | Admitting: Family Medicine

## 2018-04-10 ENCOUNTER — Encounter: Payer: Self-pay | Admitting: Family Medicine

## 2019-01-25 IMAGING — MR MR KNEE*L* WO/W CM
10 of 11 series · 36 of 40 positions shown · IV contrast (multihance)
Comparison: Plain films left knee 07/08/2016.

CLINICAL DATA: Medial left knee pain and swelling for 2 months. No
known injury. History of breast cancer.

EXAM:
MRI OF THE LEFT KNEE WITHOUT AND WITH CONTRAST
TECHNIQUE: Multiplanar, multisequence MR imaging of the knee was performed
before and after the administration of intravenous contrast.
CONTRAST:  19 ml MULTIHANCE GADOBENATE DIMEGLUMINE 529 MG/ML IV SOLN

[Series 3: PD fat-sat · axial · 3.0mm · 0.31mm/px · z∈[-61,+85]mm · 4 of 45 slices shown (1 of 4)]
[im 1/45]
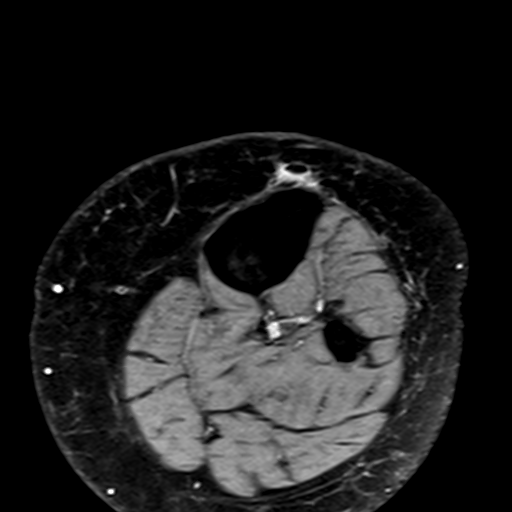
[im 15/45]
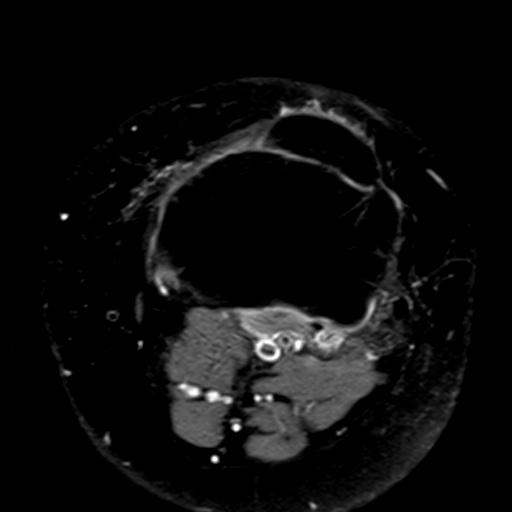
[im 30/45]
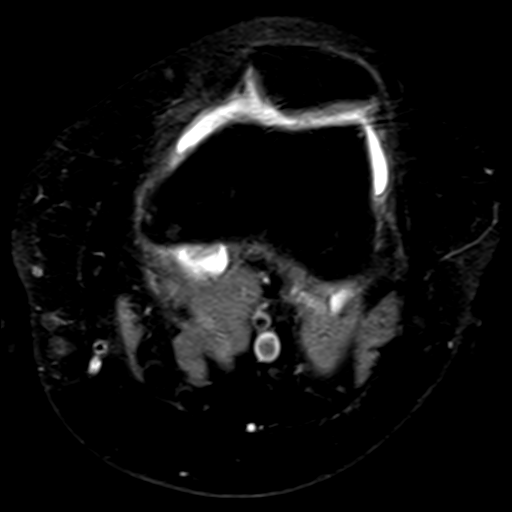
[im 45/45]
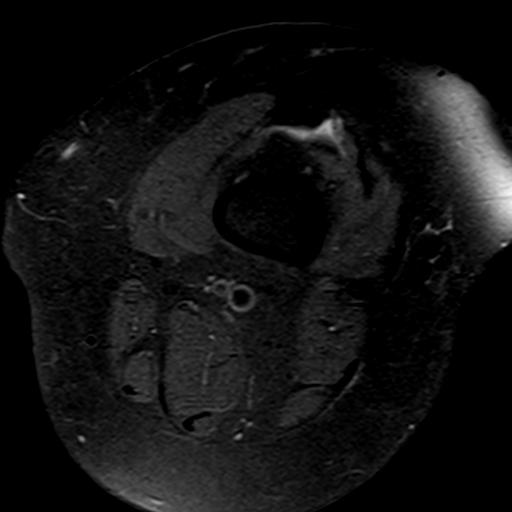

[Series 4: T1 · coronal · 3.0mm · 0.56mm/px · 3 of 35 slices shown]
[im 1/35]
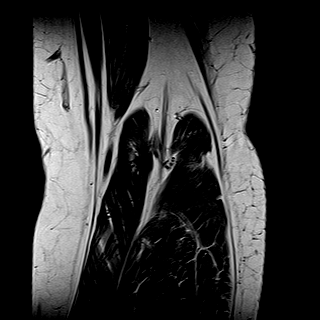
[im 18/35]
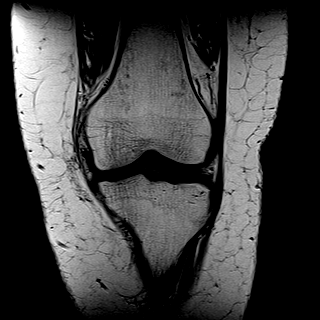
[im 35/35]
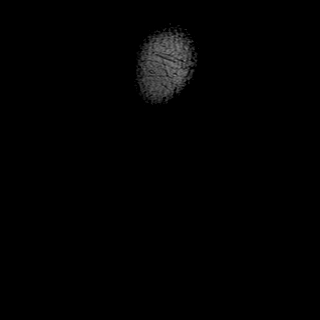

[Series 5: T2 fat-sat · coronal · 3.0mm · 0.56mm/px · 3 of 35 slices shown]
[im 1/35]
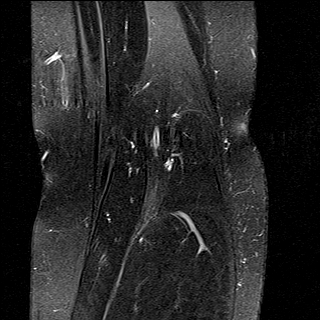
[im 18/35]
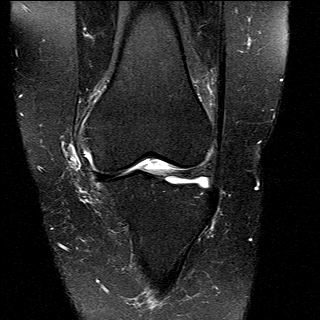
[im 35/35]
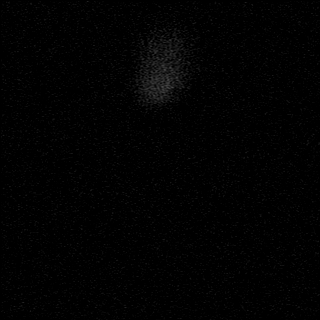

[Series 6: PD fat-sat · coronal · 3.0mm · 0.70mm/px · 4 of 35 slices shown (2 of 4)]
[im 1/35]
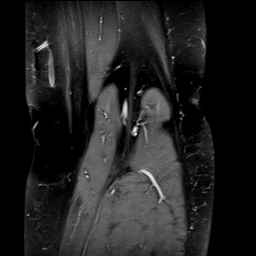
[im 12/35]
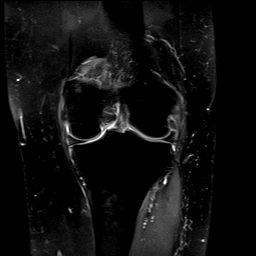
[im 23/35]
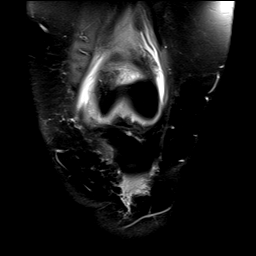
[im 35/35]
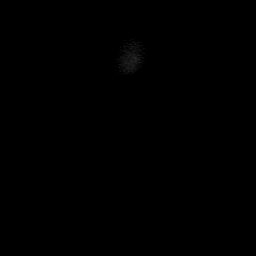

[Series 7: PD fat-sat · sagittal · 3.0mm · 0.70mm/px · 4 of 35 slices shown (3 of 4)]
[im 1/35]
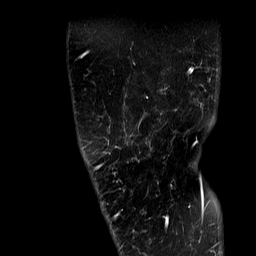
[im 12/35]
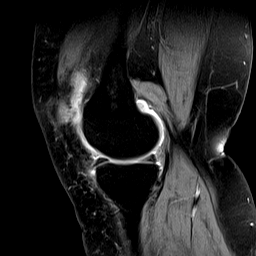
[im 23/35]
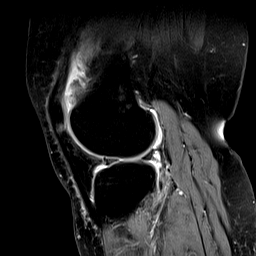
[im 35/35]
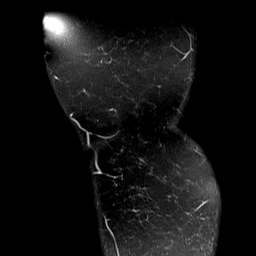

[Series 8: STIR · sagittal · 3.0mm · 0.35mm/px · 4 of 35 slices shown]
[im 1/35]
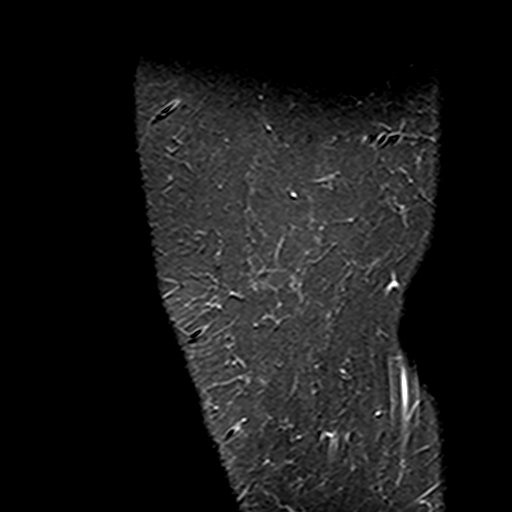
[im 12/35]
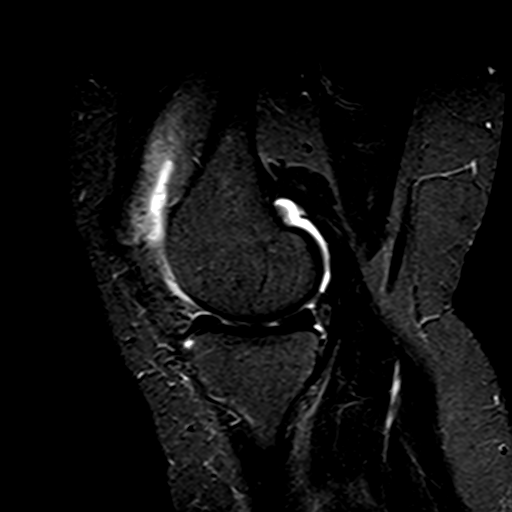
[im 23/35]
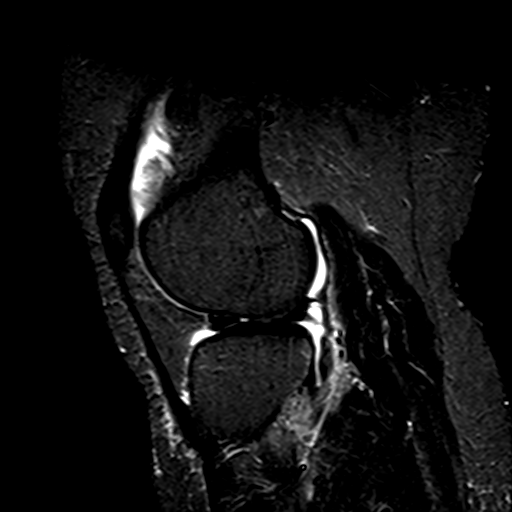
[im 35/35]
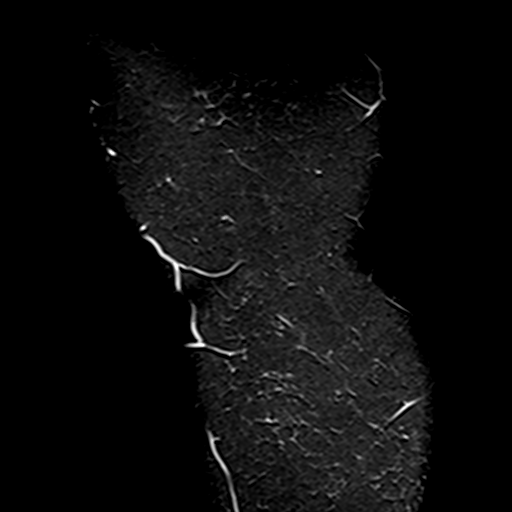

[Series 9: PD fat-sat · coronal · 2.0mm · 0.62mm/px · 2 of 19 slices shown (4 of 4)]
[im 1/19]
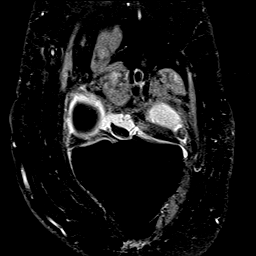
[im 19/19]
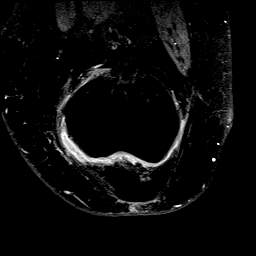

[Series 10: T1 fat-sat · coronal · non-contrast · 3.0mm · 0.56mm/px · 4 of 35 slices shown]
[im 1/35]
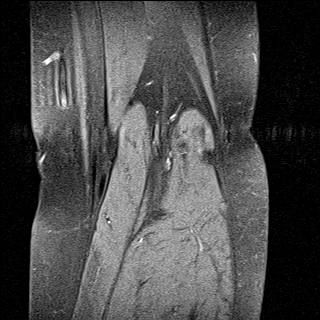
[im 12/35]
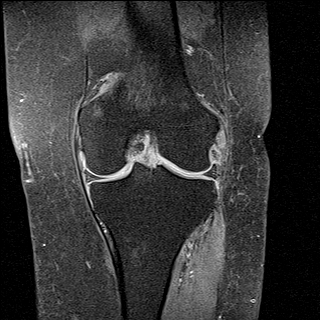
[im 23/35]
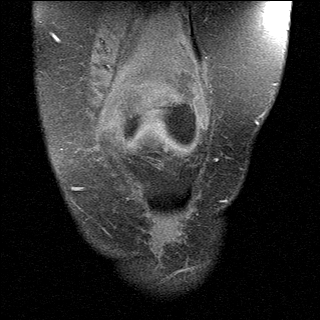
[im 35/35]
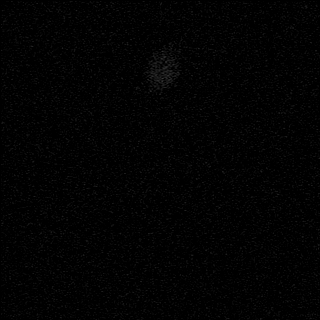

[Series 12: T1 fat-sat post-contrast · coronal · 3.0mm · 0.56mm/px · 4 of 35 slices shown (1 of 2)]
[im 1/35]
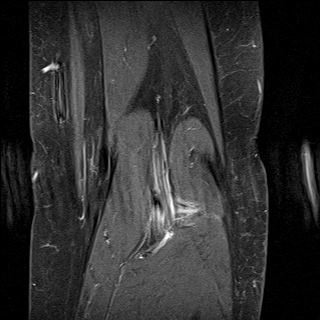
[im 12/35]
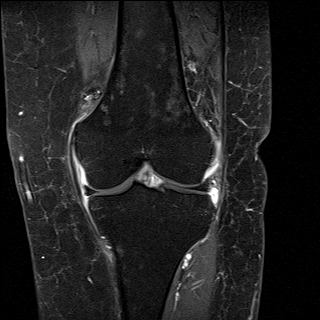
[im 23/35]
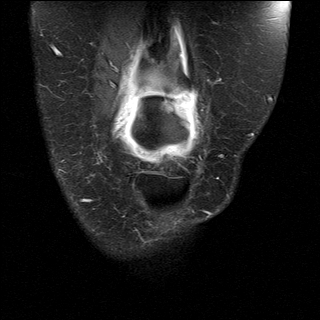
[im 35/35]
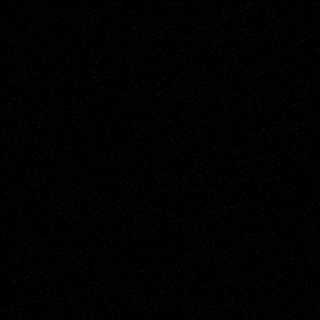

[Series 13: T1 fat-sat post-contrast · sagittal · 3.0mm · 0.56mm/px · 4 of 35 slices shown (2 of 2)]
[im 1/35]
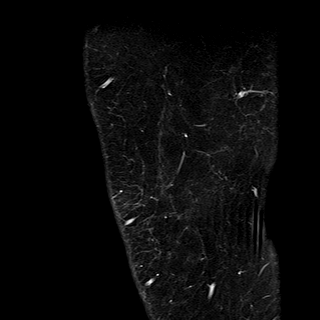
[im 12/35]
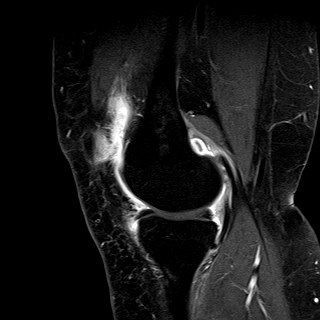
[im 23/35]
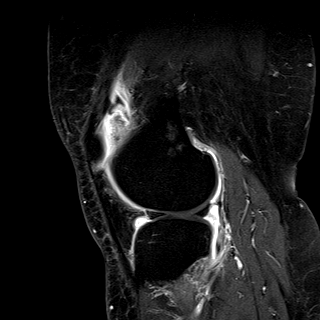
[im 35/35]
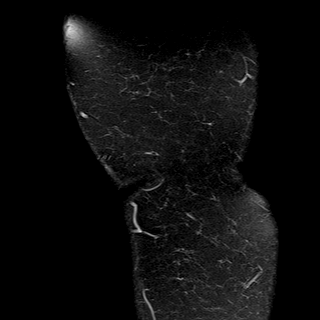

[36 of 40 positions shown; findings below may reference images not displayed]

FINDINGS: MENISCI

Medial meniscus: A focal horizontal tear is seen in the mid aspect
of the posterior horn. No displaced fragment.

Lateral meniscus:  Intact.

LIGAMENTS

Cruciates:  Intact.

Collaterals:  Intact.

CARTILAGE

Patellofemoral:  Normal.

Medial:  Normal.

Lateral:  Normal.

Joint: Small effusion. Synovium about the knee appears somewhat
thickened.

Popliteal Fossa:  No Baker's cyst.

Extensor Mechanism:  Intact.

Bones: Normal marrow signal throughout. No evidence of metastatic
disease.

Other: None.
IMPRESSION: Focal horizontal tear central posterior horn medial meniscus.

Small joint effusion and synovial prominence compatible with
synovitis.
